# Patient Record
Sex: Male | Born: 1985 | Hispanic: No | Marital: Single | Smoking: Never smoker
Health system: Southern US, Community
[De-identification: ages and names within clinical notes are randomized; demographics above are authoritative.]

## PROBLEM LIST (undated history)

## (undated) HISTORY — PX: HAND SURGERY: SHX662

---
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
## 2007-11-04 ENCOUNTER — Emergency Department (HOSPITAL_COMMUNITY): Admission: EM | Admit: 2007-11-04 | Discharge: 2007-11-04 | Payer: Self-pay | Admitting: Emergency Medicine

## 2011-05-10 ENCOUNTER — Ambulatory Visit: Payer: Self-pay | Admitting: Internal Medicine

## 2011-05-10 LAB — RAPID STREP-A WITH REFLX: Micro Text Report: NEGATIVE

## 2011-05-12 LAB — BETA STREP CULTURE(ARMC)

## 2012-04-26 ENCOUNTER — Ambulatory Visit: Payer: Self-pay | Admitting: Emergency Medicine

## 2012-08-25 ENCOUNTER — Emergency Department: Payer: Self-pay | Admitting: Emergency Medicine

## 2012-08-25 ENCOUNTER — Ambulatory Visit: Payer: Self-pay

## 2013-05-17 ENCOUNTER — Ambulatory Visit: Payer: Self-pay | Admitting: Physician Assistant

## 2013-05-21 ENCOUNTER — Ambulatory Visit: Payer: Self-pay | Admitting: Orthopedic Surgery

## 2014-04-09 HISTORY — PX: HAND SURGERY: SHX662

## 2014-07-31 NOTE — Op Note (Signed)
PATIENT NAME:  Maxwell MillinRKER, [name] MR#:  [id number] DATE OF BIRTH:  1985/06/19  DATE OF PROCEDURE:  05/21/2013  PREOPERATIVE DIAGNOSIS: Right distal fifth metacarpal fracture.   POSTOPERATIVE DIAGNOSIS: Right distal fifth metacarpal fracture.   PROCEDURE: Closed reduction and percutaneous pinning, right distal fifth metacarpal fracture.   ANESTHESIA: General.   SURGEON: Leitha SchullerMichael J. Milton Streicher, M.D.   DESCRIPTION OF PROCEDURE: The patient was brought to the operating room and after adequate anesthesia was obtained, the right arm was prepped and draped in the usual sterile fashion. Appropriate patient identification and timeout procedures were completed. The mini C-arm was brought in, and with flexion of the IP joint and MCP joint, essentially anatomical reduction of the metacarpal neck fracture was obtained. Two K-wires were then inserted in a crossed fashion from the fifth metacarpal head into the fourth metacarpal shaft, and after good position of the pins was identified, the pins were bent over, cut short. Xeroform was placed around the base of the pins, followed by 4 x 4's, Webril and an ulnar gutter splint, followed by an Ace wrap. The patient was then sent to the recovery room in stable condition.   ESTIMATED BLOOD LOSS: Minimal.   COMPLICATIONS: None.   SPECIMEN: None.   IMPLANT: A 0.45 K-wire x 2.   ____________________________ Leitha SchullerMichael J. Finnis Colee, MD mjm:gb D: 05/21/2013 20:01:54 ET T: 05/22/2013 03:23:10 ET JOB#: [id number]  cc: Leitha SchullerMichael J. Yaphet Smethurst, MD, <Dictator> Leitha SchullerMICHAEL J Dezire Turk MD ELECTRONICALLY SIGNED 05/22/2013 12:46

## 2016-04-12 ENCOUNTER — Ambulatory Visit (INDEPENDENT_AMBULATORY_CARE_PROVIDER_SITE_OTHER): Payer: Worker's Compensation

## 2016-04-12 ENCOUNTER — Ambulatory Visit
Admission: EM | Admit: 2016-04-12 | Discharge: 2016-04-12 | Disposition: A | Discharge status: Home / Self Care | Payer: Worker's Compensation | Attending: Family Medicine | Admitting: Family Medicine

## 2016-04-12 DIAGNOSIS — G5602 Carpal tunnel syndrome, left upper limb: Secondary | ICD-10-CM

## 2016-04-12 MED ORDER — NAPROXEN 500 MG PO TABS: 500.0000 mg | ORAL_TABLET | Freq: Two times a day (BID) | ORAL | 0 refills | Status: DC

## 2016-04-12 NOTE — Discharge Instructions (Signed)
Wear the wrist splint for full-time coming out only for personal care for 1 week. Wear the wrist splint for all activities particularly at work for the next month. Take Naprosyn twice daily with food. Follow up with Glenard Haringommy Moore, FNP at [facility] occupational health next week.

## 2016-04-12 NOTE — ED Triage Notes (Signed)
He states he has left hand pian, that has lasted for over a month. The pain is constant. 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

## 2016-04-12 NOTE — ED Provider Notes (Signed)
CSN: [id number]     Arrival date & time 04/12/16  1706 History   First MD Initiated Contact with Patient 04/12/16 1845     Chief Complaint  Patient presents with  . Hand Pain    left hand   (Consider location/radiation/quality/duration/timing/severity/associated sxs/prior Treatment) HPI  This a 31 year old male who presents with non-dominant left wrist pain and numbness that he has noticed for over a month. He states that the pain is constant and he has intermittent numbness and tingling in a median distribution. He states that it also bothers him whenever he is driving. It has not awakened him at night. 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 Will grab the corneal with his left hand place it on the engine and use a drill with the right hand. He states that it hurts every day that he uses it. Not been taking any medications.       History reviewed. No pertinent past medical history. History reviewed. No pertinent surgical history. History reviewed. No pertinent family history. Social History  Substance Use Topics  . Smoking status: Never Smoker  . Smokeless tobacco: Never Used  . Alcohol use No    Review of Systems  Constitutional: Positive for activity change. Negative for chills, fatigue and fever.  Musculoskeletal: Positive for myalgias.  Neurological: Positive for numbness.  All other systems reviewed and are negative.   Allergies  Patient has no known allergies.  Home Medications   Prior to Admission medications   Medication Sig Start Date End Date Taking? Authorizing Provider  naproxen (NAPROSYN) 500 MG tablet Take 1 tablet (500 mg total) by mouth 2 (two) times daily with a meal. 04/12/16   Lutricia FeilWilliam P Smayan Hackbart, PA-C   Meds Ordered and Administered this Visit  Medications - No data to display  BP (!) 148/69 (BP Location: Left Arm)   Pulse 82   Temp 98 F (36.7 C) (Oral)   Resp 18   Ht 5\' 9"  (1.753 m)   Wt  295 lb (133.8 kg)   SpO2 99%   BMI 43.56 kg/m  No data found.   Physical Exam  Constitutional: He is oriented to person, place, and time. He appears well-developed and well-nourished.  HENT:  Head: Normocephalic and atraumatic.  Eyes: EOM are normal. Pupils are equal, round, and reactive to light. Right eye exhibits no discharge. Left eye exhibits no discharge.  Neck: Normal range of motion. Neck supple.  Musculoskeletal: Normal range of motion. He exhibits tenderness. He exhibits no edema or deformity.  Examination of the left hand and wrist shows a normal sweating pattern. No atrophy observed. The thenar muscle is strong. Patient does have a Tinel's at the wrist in a median distribution. He does have a positive Phalen's sign after approximately 1 minute with burning into his fingers.  Neurological: He is alert and oriented to person, place, and time.  Skin: Skin is warm and dry.  Psychiatric: He has a normal mood and affect. His behavior is normal. Judgment and thought content normal.  Nursing note and vitals reviewed.   Urgent Care Course   Clinical Course     Procedures (including critical care time)  Labs Review Labs Reviewed - No data to display  Imaging Review Dg Wrist Complete Left  Result Date: 04/12/2016 CLINICAL DATA:  Numbness EXAM: LEFT WRIST - COMPLETE 3+ VIEW COMPARISON:  None. FINDINGS: There is no evidence of fracture or dislocation. There is no evidence  of arthropathy or other focal bone abnormality. Soft tissues are unremarkable. IMPRESSION: Negative. Electronically Signed   By: Charlett Nose M.D.   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 Test/x-ray results and diagnosis reviewed with patient 2. rx as per orders; risks, benefits, potential side effects reviewed with patient 3. Recommend supportive treatment with Use of left wrist splint for 1 week continuously removing only to perform personal care. He should use the wrist splint at nighttime and at work for 30 days. I've given him Naprosyn for the anti-inflammatory effect. He will follow-up with Glenard Haring, FNP at [facility] occupational health in one week. 4. F/u prn if symptoms worsen or don't improve     Lutricia Feil, PA-C 04/12/16 1920

## 2017-04-27 IMAGING — CR DG WRIST COMPLETE 3+V*L*
4 series · 4 of 4 positions shown · non-contrast
Comparison: None.

CLINICAL DATA: Numbness

EXAM:
LEFT WRIST - COMPLETE 3+ VIEW

[wrist pa]
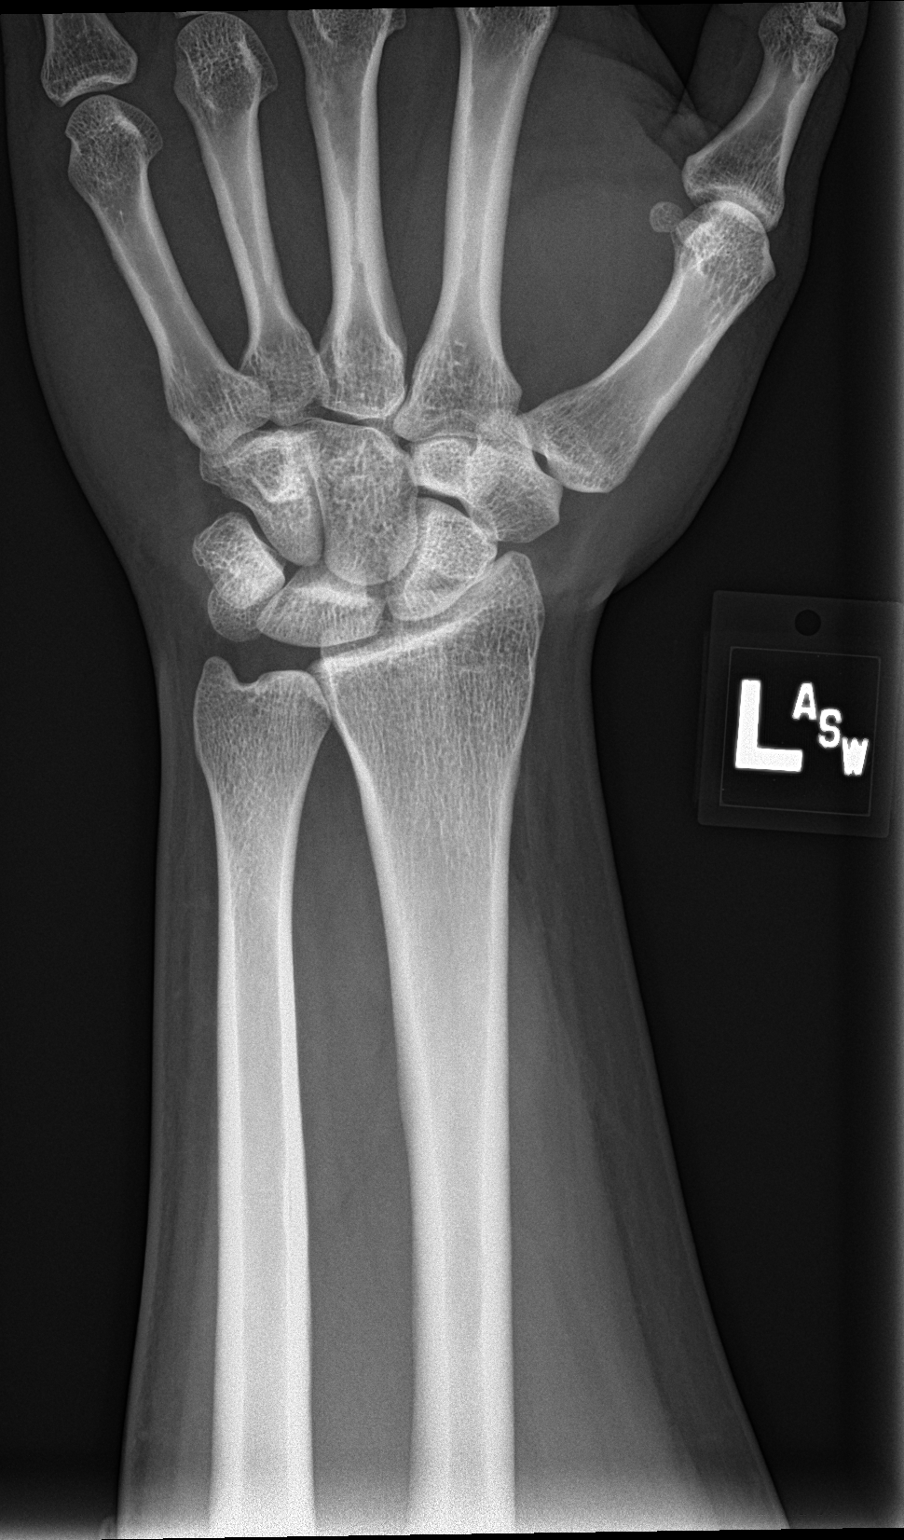

[wrist obl]
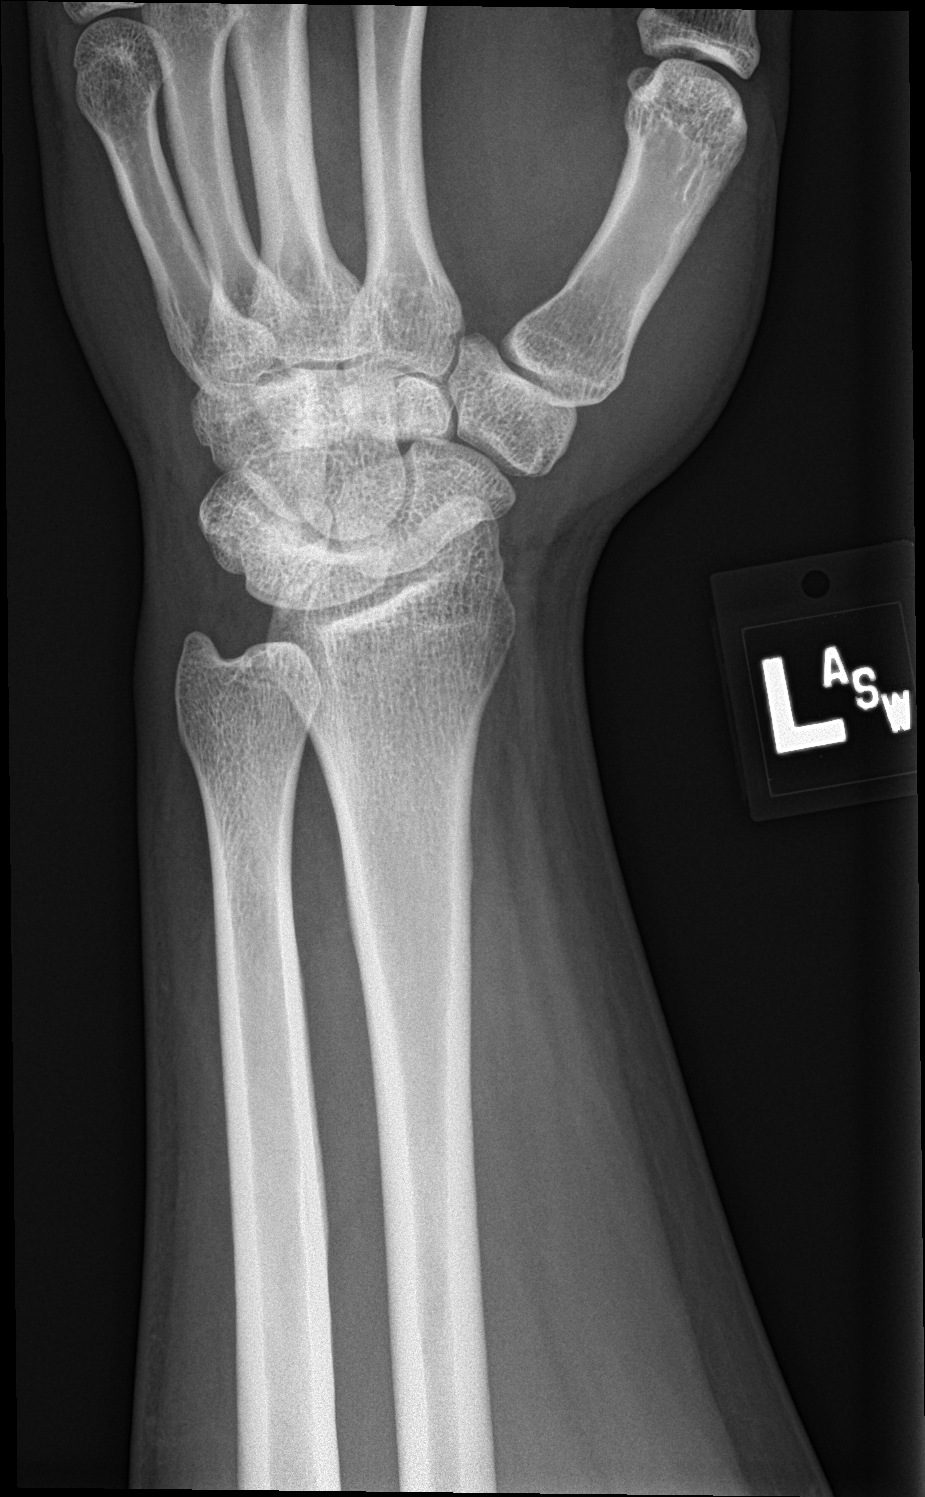

[wrist lat]
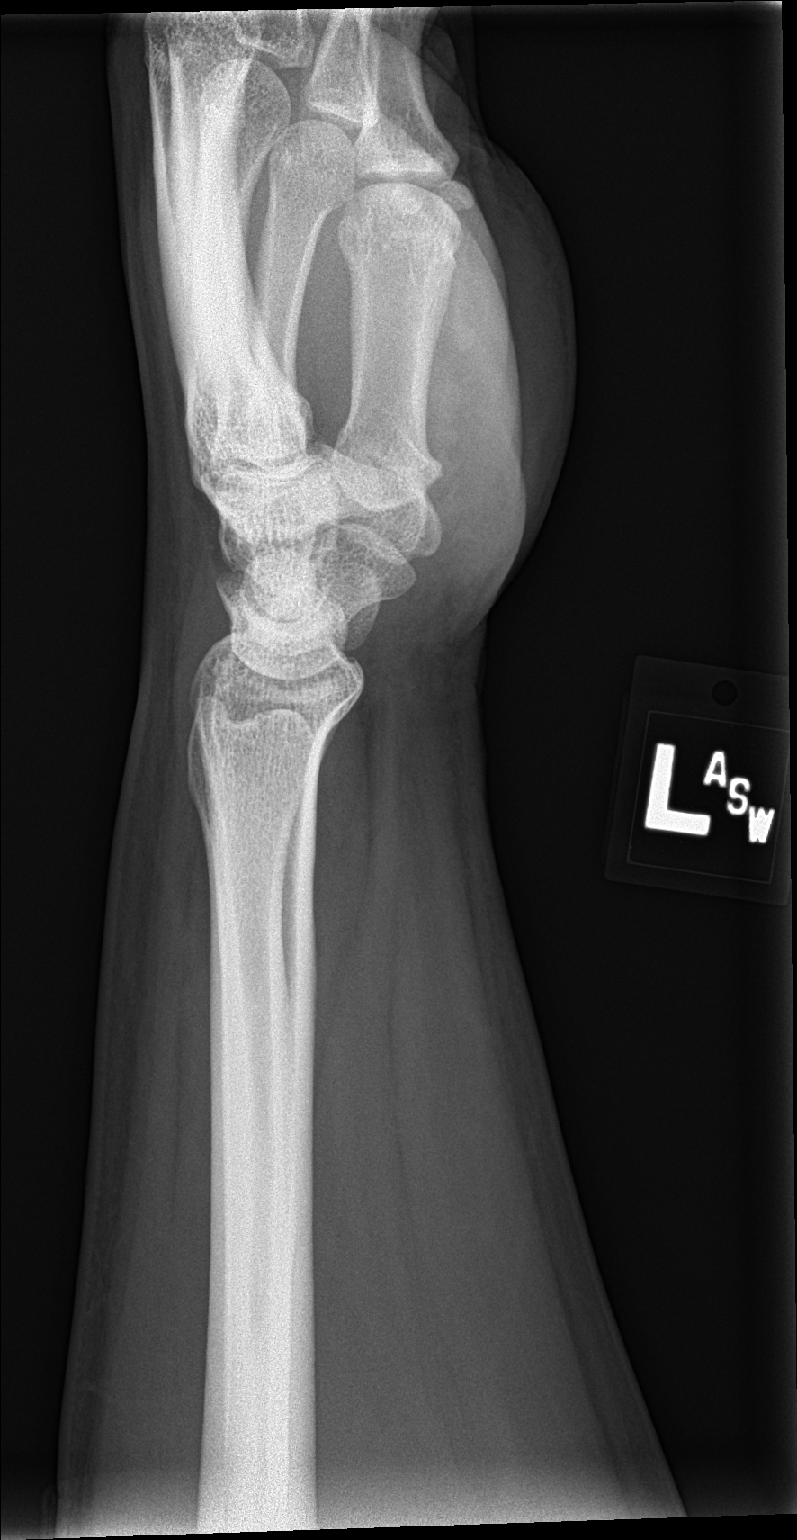

[wrist navicular]
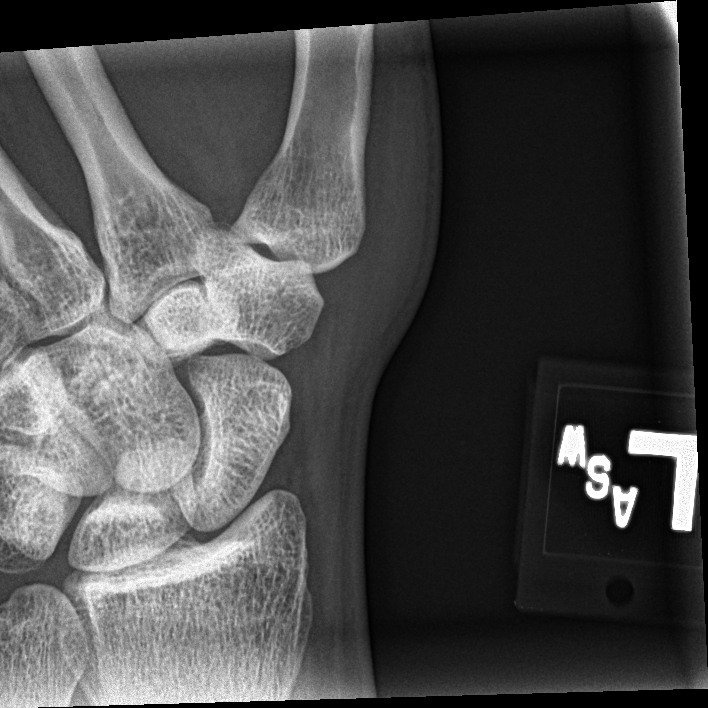

[4 of 4 positions shown; findings below may reference images not displayed]

FINDINGS: There is no evidence of fracture or dislocation. There is no
evidence of arthropathy or other focal bone abnormality. Soft
tissues are unremarkable.
IMPRESSION: Negative.

## 2021-05-22 ENCOUNTER — Other Ambulatory Visit: Payer: Self-pay

## 2021-05-22 ENCOUNTER — Ambulatory Visit
Admission: EM | Admit: 2021-05-22 | Discharge: 2021-05-22 | Disposition: A | Discharge status: Home / Self Care | Payer: BC Managed Care – PPO | Attending: Internal Medicine | Admitting: Internal Medicine

## 2021-05-22 ENCOUNTER — Encounter: Payer: Self-pay | Admitting: Emergency Medicine

## 2021-05-22 DIAGNOSIS — M7918 Myalgia, other site: Secondary | ICD-10-CM

## 2021-05-22 MED ORDER — IBUPROFEN 600 MG PO TABS: 600.0000 mg | ORAL_TABLET | Freq: Four times a day (QID) | ORAL | 0 refills | Status: DC | PRN

## 2021-05-22 MED ORDER — METHOCARBAMOL 500 MG PO TABS: 500.0000 mg | ORAL_TABLET | Freq: Every day | ORAL | 0 refills | Status: DC

## 2021-05-22 NOTE — ED Provider Notes (Signed)
[facility]    CSN: [id number] Arrival date & time: 05/22/21  1131      History   Chief Complaint Chief Complaint  Patient presents with   Shoulder Pain    left    HPI Maxwell Joyce is a 36 y.o. male comes to the urgent care with a 3-day history of left upper back pain.  Symptoms started fairly suddenly and has been persistent.  He denies any trauma or falls to the left upper back area.  Pain is throbbing, intermittent, aggravated by palpation and denies any relieving factors.  Pain is not associated with numbness or bruising.  Patient's job requires repetitive movement of the left upper extremity.  This is the first episode of such symptoms.  Patient denies any fever or chills.  No upper respiratory infection symptoms.Marland Kitchen   HPI  History reviewed. No pertinent past medical history.  There are no problems to display for this patient.   History reviewed. No pertinent surgical history.     Home Medications    Prior to Admission medications   Medication Sig Start Date End Date Taking? Authorizing Provider  ibuprofen (ADVIL) 600 MG tablet Take 1 tablet (600 mg total) by mouth every 6 (six) hours as needed. 05/22/21  Yes Shanitra Phillippi, Britta Mccreedy, MD  methocarbamol (ROBAXIN) 500 MG tablet Take 1 tablet (500 mg total) by mouth at bedtime. 05/22/21  Yes Fonnie Crookshanks, Britta Mccreedy, MD    Family History History reviewed. No pertinent family history.  Social History Social History   Tobacco Use   Smoking status: Never   Smokeless tobacco: Never  Vaping Use   Vaping Use: Never used  Substance Use Topics   Alcohol use: No   Drug use: No     Allergies   Patient has no known allergies.   Review of Systems Review of Systems  Musculoskeletal:  Positive for back pain and myalgias. Negative for arthralgias, gait problem, joint swelling, neck pain and neck stiffness.  Skin: Negative.     Physical Exam Triage Vital Signs ED Triage Vitals  Enc Vitals Group     BP 05/22/21  1147 130/81     Pulse Rate 05/22/21 1147 80     Resp 05/22/21 1147 18     Temp 05/22/21 1147 98.4 F (36.9 C)     Temp Source 05/22/21 1147 Oral     SpO2 05/22/21 1147 100 %     Weight 05/22/21 1146 294 lb 15.6 oz (133.8 kg)     Height 05/22/21 1146 5\' 9"  (1.753 m)     Head Circumference --      Peak Flow --      Pain Score 05/22/21 1145 6     Pain Loc --      Pain Edu? --      Excl. in GC? --    No data found.  Updated Vital Signs BP 130/81 (BP Location: Left Arm)    Pulse 80    Temp 98.4 F (36.9 C) (Oral)    Resp 18    Ht 5\' 9"  (1.753 m)    Wt 133.8 kg    SpO2 100%    BMI 43.56 kg/m   Visual Acuity Right Eye Distance:   Left Eye Distance:   Bilateral Distance:    Right Eye Near:   Left Eye Near:    Bilateral Near:     Physical Exam Vitals and nursing note reviewed.  Constitutional:      General: He is not  in acute distress.    Appearance: He is not ill-appearing.  Cardiovascular:     Rate and Rhythm: Normal rate and regular rhythm.  Pulmonary:     Effort: Pulmonary effort is normal.     Breath sounds: Normal breath sounds.  Abdominal:     General: Bowel sounds are normal.     Palpations: Abdomen is soft.  Musculoskeletal:        General: Tenderness present. No swelling, deformity or signs of injury. Normal range of motion.  Skin:    General: Skin is warm.     Findings: No bruising or erythema.  Neurological:     Mental Status: He is alert.     UC Treatments / Results  Labs (all labs ordered are listed, but only abnormal results are displayed) Labs Reviewed - No data to display  EKG   Radiology No results found.  Procedures Procedures (including critical care time)  Medications Ordered in UC Medications - No data to display  Initial Impression / Assessment and Plan / UC Course  I have reviewed the triage vital signs and the nursing notes.  Pertinent labs & imaging results that were available during my care of the patient were reviewed by me  and considered in my medical decision making (see chart for details).     1.  Paraspinal muscle pain: Patient is currently denying any pain Physical exam is negative for masses Heating pad use on a 20-minute on-20-minute of cycle recommended Gentle range of motion exercises Robaxin at bedtime as needed for muscle stiffness Ibuprofen 600 mg every 6 hours as needed for pain Return precautions given. Final Clinical Impressions(s) / UC Diagnoses   Final diagnoses:  Pain of paraspinal muscle     Discharge Instructions      Gentle range of motion exercises Heating pad use on a 20-minute on-20 minutes off cycle recommended Take medications as prescribed Please do not drive or operate heavy machinery after taking muscle relaxants.  Muscle relaxants makes you drowsy. Return to urgent care if symptoms worsen.   ED Prescriptions     Medication Sig Dispense Auth. Provider   methocarbamol (ROBAXIN) 500 MG tablet Take 1 tablet (500 mg total) by mouth at bedtime. 20 tablet Carigan Lister, Britta Mccreedy, MD   ibuprofen (ADVIL) 600 MG tablet Take 1 tablet (600 mg total) by mouth every 6 (six) hours as needed. 30 tablet Berdena Cisek, Britta Mccreedy, MD      PDMP not reviewed this encounter.   Merrilee Jansky, MD 05/22/21 1301

## 2021-05-22 NOTE — ED Triage Notes (Signed)
Pt c/o left shoulder pain. He states it is more in his shoulder blade. Started about 3 days ago but worse today. He denies injury. He states he can lift his arm but if he tries to move it other directions the pain is worse.

## 2021-05-22 NOTE — Discharge Instructions (Signed)
Gentle range of motion exercises Heating pad use on a 20-minute on-20 minutes off cycle recommended Take medications as prescribed Please do not drive or operate heavy machinery after taking muscle relaxants.  Muscle relaxants makes you drowsy. Return to urgent care if symptoms worsen.

## 2021-09-08 ENCOUNTER — Ambulatory Visit: Payer: Self-pay | Admitting: Family Medicine

## 2022-02-14 ENCOUNTER — Ambulatory Visit
Admission: EM | Admit: 2022-02-14 | Discharge: 2022-02-14 | Disposition: A | Discharge status: Home / Self Care | Payer: BC Managed Care – PPO | Attending: Emergency Medicine | Admitting: Emergency Medicine

## 2022-02-14 ENCOUNTER — Encounter: Payer: Self-pay | Admitting: Emergency Medicine

## 2022-02-14 DIAGNOSIS — J069 Acute upper respiratory infection, unspecified: Secondary | ICD-10-CM | POA: Diagnosis not present

## 2022-02-14 MED ORDER — AMOXICILLIN-POT CLAVULANATE 875-125 MG PO TABS: 1.0000 | ORAL_TABLET | Freq: Two times a day (BID) | ORAL | 0 refills | Status: AC

## 2022-02-14 MED ORDER — BENZONATATE 100 MG PO CAPS: 100.0000 mg | ORAL_CAPSULE | Freq: Three times a day (TID) | ORAL | 0 refills | Status: DC

## 2022-02-14 MED ORDER — PROMETHAZINE-DM 6.25-15 MG/5ML PO SYRP: 5.0000 mL | ORAL_SOLUTION | Freq: Four times a day (QID) | ORAL | 0 refills | Status: DC | PRN

## 2022-02-14 MED ORDER — PREDNISONE 20 MG PO TABS: 40.0000 mg | ORAL_TABLET | Freq: Every day | ORAL | 0 refills | Status: DC

## 2022-02-14 NOTE — ED Provider Notes (Signed)
[facility]    CSN: [id number] Arrival date & time: 02/14/22  0916      History   Chief Complaint Chief Complaint  Patient presents with   Shortness of Breath   Cough   Nasal Congestion    HPI Maxwell Joyce is a 36 y.o. male.   Patient presents with a productive and nonproductive cough, shortness of breath with exertion and wheezing present for 3 weeks.  Symptoms are not worsening but are persisting.  Shortness of breath is typically relieved within 5 to 10 minutes after rest.  Symptoms initially began as a " head cold" that moved into the chest.  No known sick contacts.  Tolerating liquids.  Has attempted use of Mucinex, Robitussin and Alka-Seltzer cold and flu which has provided minimal relief.  Denies respiratory history.  Non-smoker.  Denies fever, chills, body aches, nasal congestion, ear pain, sore throat.  History reviewed. No pertinent past medical history.  There are no problems to display for this patient.   Past Surgical History:  Procedure Laterality Date   HAND SURGERY Right        Home Medications    Prior to Admission medications   Medication Sig Start Date End Date Taking? Authorizing Provider  ibuprofen (ADVIL) 600 MG tablet Take 1 tablet (600 mg total) by mouth every 6 (six) hours as needed. 05/22/21   Lamptey, Britta Mccreedy, MD  methocarbamol (ROBAXIN) 500 MG tablet Take 1 tablet (500 mg total) by mouth at bedtime. 05/22/21   Lamptey, Britta Mccreedy, MD    Family History History reviewed. No pertinent family history.  Social History Social History   Tobacco Use   Smoking status: Never   Smokeless tobacco: Never  Vaping Use   Vaping Use: Never used  Substance Use Topics   Alcohol use: No   Drug use: No     Allergies   Patient has no known allergies.   Review of Systems Review of Systems  Constitutional: Negative.   HENT: Negative.    Respiratory:  Positive for cough, shortness of breath and wheezing. Negative for apnea, choking,  chest tightness and stridor.   Cardiovascular: Negative.   Gastrointestinal: Negative.   Skin: Negative.   Neurological: Negative.      Physical Exam Triage Vital Signs ED Triage Vitals  Enc Vitals Group     BP 02/14/22 0947 134/85     Pulse Rate 02/14/22 0947 79     Resp 02/14/22 0947 16     Temp 02/14/22 0947 98.4 F (36.9 C)     Temp Source 02/14/22 0947 Oral     SpO2 02/14/22 0947 96 %     Weight --      Height --      Head Circumference --      Peak Flow --      Pain Score 02/14/22 0945 0     Pain Loc --      Pain Edu? --      Excl. in GC? --    No data found.  Updated Vital Signs BP 134/85 (BP Location: Left Arm)   Pulse 79   Temp 98.4 F (36.9 C) (Oral)   Resp 16   SpO2 96%   Visual Acuity Right Eye Distance:   Left Eye Distance:   Bilateral Distance:    Right Eye Near:   Left Eye Near:    Bilateral Near:     Physical Exam Constitutional:      Appearance: He is well-developed.  HENT:     Head: Normocephalic.     Right Ear: Tympanic membrane, ear canal and external ear normal.     Left Ear: Tympanic membrane, ear canal and external ear normal.     Nose: Nose normal.     Mouth/Throat:     Mouth: Mucous membranes are moist.     Pharynx: Oropharynx is clear.  Eyes:     Extraocular Movements: Extraocular movements intact.  Cardiovascular:     Rate and Rhythm: Normal rate and regular rhythm.     Pulses: Normal pulses.     Heart sounds: Normal heart sounds.  Pulmonary:     Effort: Pulmonary effort is normal.     Breath sounds: Wheezing present.  Skin:    General: Skin is warm and dry.  Neurological:     Mental Status: He is alert and oriented to person, place, and time. Mental status is at baseline.  Psychiatric:        Mood and Affect: Mood normal.        Behavior: Behavior normal.      UC Treatments / Results  Labs (all labs ordered are listed, but only abnormal results are displayed) Labs Reviewed - No data to  display  EKG   Radiology No results found.  Procedures Procedures (including critical care time)  Medications Ordered in UC Medications - No data to display  Initial Impression / Assessment and Plan / UC Course  I have reviewed the triage vital signs and the nursing notes.  Pertinent labs & imaging results that were available during my care of the patient were reviewed by me and considered in my medical decision making (see chart for details).  Acute upper respiratory infection  Vital signs are stable, patient is in no signs of distress nor toxic appearing, as symptoms have persisted for 3 weeks without signs of resolution we will provide bacterial coverage, Augmentin prescribed, as wheezing is heard to auscultation additionally prednisone, Tessalon and Promethazine DM prescribed for additional supportive measures, may continue use of over-the-counter medications if deemed helpful, given precautions to follow-up if symptoms continue to persist past use of medications Final Clinical Impressions(s) / UC Diagnoses   Final diagnoses:  None   Discharge Instructions   None    ED Prescriptions   None    PDMP not reviewed this encounter.   Valinda Hoar, NP 02/14/22 1008

## 2022-02-14 NOTE — ED Triage Notes (Signed)
Pt presents with a cough, runny nose and SOB x 3 weeks.

## 2022-02-14 NOTE — Discharge Instructions (Signed)
While your symptoms most likely started out as a virus as they have persisted for 3 weeks it is very possible that bacteria is now present and is prolonging your symptoms therefore you will be started on antibiotic today  On your exam I was able to hear wheezing which is a sign of constriction to the airway which is why you are experiencing shortness of breath  Take Augmentin every morning and every evening for 10 days ideally you will begin to see improvement in about 48 hours  Begin prednisone every morning with food for 5 days, this reduces inflammation and irritation to the airways which will help calm your wheezing and shortness of breath  You may use Tessalon pill every 8 hours as needed to help calm your coughing  You may use cough syrup every 6 hours as needed for additional comfort, be mindful this will make you drowsy    You can take Tylenol and/or Ibuprofen as needed for fever reduction and pain relief.   For cough: honey 1/2 to 1 teaspoon (you can dilute the honey in water or another fluid).  You can also use guaifenesin and dextromethorphan for cough. You can use a humidifier for chest congestion and cough.  If you don't have a humidifier, you can sit in the bathroom with the hot shower running.      For sore throat: try warm salt water gargles, cepacol lozenges, throat spray, warm tea or water with lemon/honey, popsicles or ice, or OTC cold relief medicine for throat discomfort.   For congestion: take a daily anti-histamine like Zyrtec, Claritin, and a oral decongestant, such as pseudoephedrine.  You can also use Flonase 1-2 sprays in each nostril daily.   It is important to stay hydrated: drink plenty of fluids (water, gatorade/powerade/pedialyte, juices, or teas) to keep your throat moisturized and help further relieve irritation/discomfort.

## 2022-03-20 ENCOUNTER — Other Ambulatory Visit: Payer: Self-pay | Admitting: Family Medicine

## 2022-03-20 ENCOUNTER — Ambulatory Visit: Payer: BC Managed Care – PPO | Admitting: Family Medicine

## 2022-03-20 ENCOUNTER — Encounter: Payer: Self-pay | Admitting: Family Medicine

## 2022-03-20 VITALS — BP 122/73 | HR 80 | Ht 69.5 in | Wt 317.0 lb

## 2022-03-20 DIAGNOSIS — G4719 Other hypersomnia: Secondary | ICD-10-CM

## 2022-03-20 DIAGNOSIS — Z0000 Encounter for general adult medical examination without abnormal findings: Secondary | ICD-10-CM

## 2022-03-20 DIAGNOSIS — R29818 Other symptoms and signs involving the nervous system: Secondary | ICD-10-CM | POA: Diagnosis not present

## 2022-03-20 DIAGNOSIS — Z7689 Persons encountering health services in other specified circumstances: Secondary | ICD-10-CM

## 2022-03-20 DIAGNOSIS — Z131 Encounter for screening for diabetes mellitus: Secondary | ICD-10-CM

## 2022-03-20 DIAGNOSIS — Z6841 Body Mass Index (BMI) 40.0 and over, adult: Secondary | ICD-10-CM

## 2022-03-20 DIAGNOSIS — Z13220 Encounter for screening for lipoid disorders: Secondary | ICD-10-CM

## 2022-03-20 NOTE — Progress Notes (Signed)
Subjective:    Patient ID: Maxwell Joyce, male    DOB: 02-04-1986, 36 y.o.   MRN: [id number]  Maxwell Joyce is a 35 y.o. male presenting on 03/20/2022 for Establish Care  Here to establish care with new PCP  HPI  Suspected Sleep Apnea Fatigue / Tired He feels drained overall for several months, not improving. Feels tired Does not take in caffeine much. No sodas or energy drinks. Mostly drinks water and some powerade Sleep occasionally wake up overnight to use restroom Tried Vitamin B12 OTC no help.  Morbid Obesity BMI >46 Weight up to 317 lbs,  prior 295 lbs. 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 He is R hand dominant History of Carpal Tunnel L side - may affect him if he is driving but not at work. History of R hand boxer's fracture   Epworth Sleepiness Scale Total Score: 18 Sitting and reading - 3 Watching TV - 3 Sitting inactive in a public place - 3 As a passenger in a car for an hour without a break - 3 Lying down to rest in the afternoon when circumstances permit - 3 Sitting and talking to someone - 0 Sitting quietly after a lunch without alcohol - 3 In a car, while stopped for a few minutes in traffic - 0   STOP-Bang OSA scoring Snoring yes   Tiredness yes   Observed apneas no   Pressure HTN no   BMI > 35 kg/m2 yes   Age > 50  no   Neck (male >17 in; Male >16 in)  yes >17"  Gender male yes   OSA risk low (0-2)  OSA risk intermediate (3-4)  OSA risk high (5+)  Total: 5 high risk         03/20/2022   10:30 AM  Depression screen PHQ 2/9  Decreased Interest 2  Down, Depressed, Hopeless 0  PHQ - 2 Score 2  Altered sleeping 0  Tired, decreased energy 3  Change in appetite 0  Feeling bad or failure  about yourself  0  Trouble concentrating 0  Moving slowly or fidgety/restless 0  Suicidal thoughts 0  PHQ-9 Score 5  Difficult doing work/chores Not difficult at all    History reviewed. No pertinent past medical history. Past Surgical History:  Procedure Laterality Date   HAND SURGERY Right 2016   Boxer's fracture, repaired   TONSILLECTOMY Bilateral 1994   Social History   Socioeconomic History   Marital status: Single    Spouse name: Not on file   Number of children: Not on file   Years of education: Not on file   Highest education level: Not on file  Occupational History   Not on file  Tobacco Use   Smoking status: Never   Smokeless tobacco: Never  Vaping Use   Vaping Use: Never used  Substance and Sexual Activity   Alcohol use: No   Drug use: No   Sexual activity: Not on file  Other Topics Concern   Not on file  Social History Narrative   Not on file   Social Determinants of Health   Financial Resource Strain: Not on file  Food Insecurity: Not on file  Transportation Needs: Not on file  Physical Activity:  Not on file  Stress: Not on file  Social Connections: Not on file  Intimate Partner Violence: Not on file   Family History  Problem Relation Age of Onset   Hypertension Father    Stroke Maternal Grandmother    Dementia Maternal Grandmother    Parkinson's disease Paternal Grandmother    No current outpatient medications on file prior to visit.   No current facility-administered medications on file prior to visit.    Review of Systems Per HPI unless specifically indicated above      Objective:    BP 122/73   Pulse 80   Ht 5' 9.5" (1.765 m)   Wt (!) 317 lb (143.8 kg)   SpO2 99%   BMI 46.14 kg/m   Wt Readings from Last 3 Encounters:  03/20/22 (!) 317 lb (143.8 kg)  05/22/21 294 lb 15.6 oz (133.8 kg)  04/12/16 295 lb (133.8 kg)    Physical Exam Vitals and nursing note reviewed.  Constitutional:      General: He is not in acute  distress.    Appearance: Normal appearance. He is well-developed. He is obese. He is not diaphoretic.     Comments: Well-appearing, comfortable, cooperative  HENT:     Head: Normocephalic and atraumatic.  Eyes:     General:        Right eye: No discharge.        Left eye: No discharge.     Conjunctiva/sclera: Conjunctivae normal.  Cardiovascular:     Rate and Rhythm: Normal rate.  Pulmonary:     Effort: Pulmonary effort is normal.  Skin:    General: Skin is warm and dry.     Findings: No erythema or rash.  Neurological:     Mental Status: He is alert and oriented to person, place, and time.  Psychiatric:        Mood and Affect: Mood normal.        Behavior: Behavior normal.        Thought Content: Thought content normal.     Comments: Well groomed, good eye contact, normal speech and thoughts      No results found for this or any previous visit.    Assessment & Plan:   Problem List Items Addressed This Visit     Morbid obesity with BMI of 45.0-49.9, adult (HCC)   Other Visit Diagnoses     Excessive daytime sleepiness    -  Primary   Encounter to establish care with new doctor       Suspected sleep apnea           Establish care  New problem with w persistent clinical concern for suspected obstructive sleep apnea given reported symptoms without witnessed apnea, but with snoring and sleep disturbance, fatigue excessive sleepiness. - Screening: ESS score 18 / STOP-Bang Score 5 high risk - Neck Circumference: >17" - Co-morbidities: Obesity  Plan: 1. Discussion on initial diagnosis and testing for OSA, risk factors, management, complications 2. Agree to proceed with sleep study testing based on clinical concerns - will fax referral to [facility] - to arrange initial PSG home vs in sleep lab and further evaluation.   No orders of the defined types were placed in this encounter.    Follow up plan: Return in about 2 months (around 05/21/2022) for 2  month fasting lab only then 1 week later Annual Physical.  Future labs ordered 05/21/22 CMET LIPID A1c CBC TSH  Saralyn Pilar, DO [facility] 03/20/2022, 10:33 AM

## 2022-03-20 NOTE — Patient Instructions (Addendum)
Thank you for coming to the office today.  We will also check blood next time and that may help [place] understand other causes of fatigue.  [facility] Address: [address] [place], [place], [place] [address] Phone: [phone]  Referral sent via fax today. Stay tuned to hear back from them, they should call you to schedule initial sleep study, likely a home test and then if abnormal - they will arrange the 2nd part of the sleep study in the lab with the CPAP machine.  If there is any issue with this company, for example not covered by insurance or other problem, please notify us and they may do so a well - we will need to change the location of the referral.  DUE for FASTING BLOOD WORK (no food or drink after midnight before the lab appointment, only water or coffee without cream/sugar on the morning of)  SCHEDULE "Lab Only" visit in the morning at the clinic for lab draw in 2 MONTHS   - Make sure Lab Only appointment is at about 1 week before your next appointment, so that results will be available  For Lab Results, once available within 2-3 days of blood draw, you can can log in to MyChart online to view your results and a brief explanation. Also, we can discuss results at next follow-up visit.    Please schedule a Follow-up Appointment to: Return in about 2 months (around 05/21/2022) for 2 month fasting lab only then 1 week later Annual Physical.  If you have any other questions or concerns, please feel free to call the office or send a message through MyChart. You may also schedule an earlier appointment if necessary.  Additionally, you may be receiving a survey about your experience at our office within a few days to 1 week by e-mail or mail. We value your feedback.  Saralyn Pilar, DO [facility] [place]

## 2022-05-11 DIAGNOSIS — G4733 Obstructive sleep apnea (adult) (pediatric): Secondary | ICD-10-CM | POA: Diagnosis not present

## 2022-05-17 ENCOUNTER — Encounter: Payer: Self-pay | Admitting: Family Medicine

## 2022-05-17 DIAGNOSIS — G4733 Obstructive sleep apnea (adult) (pediatric): Secondary | ICD-10-CM | POA: Insufficient documentation

## 2022-05-18 ENCOUNTER — Other Ambulatory Visit: Payer: Self-pay

## 2022-05-18 DIAGNOSIS — Z131 Encounter for screening for diabetes mellitus: Secondary | ICD-10-CM

## 2022-05-18 DIAGNOSIS — Z13220 Encounter for screening for lipoid disorders: Secondary | ICD-10-CM

## 2022-05-18 DIAGNOSIS — Z0000 Encounter for general adult medical examination without abnormal findings: Secondary | ICD-10-CM

## 2022-05-21 ENCOUNTER — Other Ambulatory Visit: Payer: BC Managed Care – PPO

## 2022-05-29 ENCOUNTER — Other Ambulatory Visit: Payer: BC Managed Care – PPO

## 2022-05-29 DIAGNOSIS — Z13220 Encounter for screening for lipoid disorders: Secondary | ICD-10-CM | POA: Diagnosis not present

## 2022-05-29 DIAGNOSIS — Z6841 Body Mass Index (BMI) 40.0 and over, adult: Secondary | ICD-10-CM | POA: Diagnosis not present

## 2022-05-29 DIAGNOSIS — Z0000 Encounter for general adult medical examination without abnormal findings: Secondary | ICD-10-CM | POA: Diagnosis not present

## 2022-05-29 DIAGNOSIS — Z131 Encounter for screening for diabetes mellitus: Secondary | ICD-10-CM | POA: Diagnosis not present

## 2022-05-30 LAB — CBC WITH DIFFERENTIAL/PLATELET
Absolute Monocytes: 308 cells/uL (ref 200–950)
Basophils Absolute: 46 cells/uL (ref 0–200)
Basophils Relative: 0.6 %
Eosinophils Absolute: 100 cells/uL (ref 15–500)
Eosinophils Relative: 1.3 %
HCT: 45.1 % (ref 38.5–50.0)
Hemoglobin: 15.1 g/dL (ref 13.2–17.1)
Lymphs Abs: 2903 cells/uL (ref 850–3900)
MCH: 27.8 pg (ref 27.0–33.0)
MCHC: 33.5 g/dL (ref 32.0–36.0)
MCV: 83.1 fL (ref 80.0–100.0)
MPV: 10.1 fL (ref 7.5–12.5)
Monocytes Relative: 4 %
Neutro Abs: 4343 cells/uL (ref 1500–7800)
Neutrophils Relative %: 56.4 %
Platelets: 400 10*3/uL (ref 140–400)
RBC: 5.43 10*6/uL (ref 4.20–5.80)
RDW: 13.5 % (ref 11.0–15.0)
Total Lymphocyte: 37.7 %
WBC: 7.7 10*3/uL (ref 3.8–10.8)

## 2022-05-30 LAB — HEMOGLOBIN A1C
Hgb A1c MFr Bld: 5.8 % of total Hgb — ABNORMAL HIGH (ref <5.7)
Mean Plasma Glucose: 120 mg/dL
eAG (mmol/L): 6.6 mmol/L

## 2022-05-30 LAB — COMPLETE METABOLIC PANEL WITH GFR
AG Ratio: 1.4 (calc) (ref 1.0–2.5)
ALT: 17 U/L (ref 9–46)
AST: 14 U/L (ref 10–40)
Albumin: 4.4 g/dL (ref 3.6–5.1)
Alkaline phosphatase (APISO): 56 U/L (ref 36–130)
BUN: 15 mg/dL (ref 7–25)
CO2: 28 mmol/L (ref 20–32)
Calcium: 9.8 mg/dL (ref 8.6–10.3)
Chloride: 106 mmol/L (ref 98–110)
Creat: 1.01 mg/dL (ref 0.60–1.26)
Globulin: 3.1 g/dL (calc) (ref 1.9–3.7)
Glucose, Bld: 107 mg/dL — ABNORMAL HIGH (ref 65–99)
Potassium: 4.7 mmol/L (ref 3.5–5.3)
Sodium: 141 mmol/L (ref 135–146)
Total Bilirubin: 0.4 mg/dL (ref 0.2–1.2)
Total Protein: 7.5 g/dL (ref 6.1–8.1)
eGFR: 100 mL/min/{1.73_m2} (ref >=60)

## 2022-05-30 LAB — ADVANCED WRITTEN NOTIFICATION (AWN) TEST REFUSAL

## 2022-05-30 LAB — TSH: TSH: 1.35 mIU/L (ref 0.40–4.50)

## 2022-06-01 ENCOUNTER — Encounter: Payer: Self-pay | Admitting: Family Medicine

## 2022-06-01 ENCOUNTER — Ambulatory Visit (INDEPENDENT_AMBULATORY_CARE_PROVIDER_SITE_OTHER): Payer: BC Managed Care – PPO | Admitting: Family Medicine

## 2022-06-01 VITALS — BP 122/66 | HR 75 | Ht 69.5 in | Wt 324.0 lb

## 2022-06-01 DIAGNOSIS — G4733 Obstructive sleep apnea (adult) (pediatric): Secondary | ICD-10-CM | POA: Diagnosis not present

## 2022-06-01 DIAGNOSIS — R7303 Prediabetes: Secondary | ICD-10-CM | POA: Diagnosis not present

## 2022-06-01 DIAGNOSIS — Z0000 Encounter for general adult medical examination without abnormal findings: Secondary | ICD-10-CM

## 2022-06-01 DIAGNOSIS — Z6841 Body Mass Index (BMI) 40.0 and over, adult: Secondary | ICD-10-CM

## 2022-06-01 NOTE — Patient Instructions (Addendum)
Thank you for coming to the office today.  After you complete the next Sleep STudy with CPAP machine We will need to get it authorized by insurance. Usually the requirement is for you to wear it >70% of the nights for 30 days consecutively. Once you have done this, the machine records the data and we can use it as proof to the insurance. Please schedule a follow up to see me back after you have used the machine for at least 30 days. We can do in person or virtual  Recent Labs    05/29/22 0924  HGBA1C 5.8*   Pre Diabetes range  Goal to limit carb starches sugars  Also I would suggest 6 month follow up on weight management and A1c sugar progress.   Eat at least 3 meals and 1-2 snacks per day (don't skip breakfast).  Aim for no more than 5 hours between eating. - Tip: If you go >5 hours without eating and become very hungry, your body will supply it's own resources temporarily and you can gain extra weight when you eat.   Diet Recommendations for Preventing Diabetes   REDUCE Starchy (carb) foods include: Bread, rice, pasta, potatoes, corn, crackers, bagels, muffins, all baked goods.   FRUITS - LIMIT these HIGH sugar/carb fruits = Pineapple, Watermelon, Bananas - OKAY with these MEDIUM sugar/carb fruits = Citrus, Oranges, Grapes - PREFER these LOW sugar/carb fruits = Apples, Berries, Pears, Plums  Protein foods include: Meat, fish, poultry, eggs, dairy foods, and beans such as pinto and kidney beans (beans also provide carbohydrate).   1. Eat at least 3 meals and 1-2 snacks per day. Never go more than 4-5 hours while awake without eating.   2. Limit starchy foods to TWO per meal and ONE per snack. ONE portion of a starchy  food is equal to the following:   - ONE slice of bread (or its equivalent, such as half of a hamburger bun).   - 1/2 cup of a "scoopable" starchy food such as potatoes or rice.   - 1 OUNCE (28 grams) of starchy snacks (crackers or pretzels, look on label).   -  15 grams of carbohydrate as shown on food label.   3. Both lunch and dinner should include a protein food, a carb food, and vegetables.   - Obtain twice as many veg's as protein or carbohydrate foods for both lunch and dinner.   - Try to keep frozen veg's on hand for a quick vegetable serving.     - Fresh or frozen veg's are best.   4. Breakfast should always include protein.     Please schedule a Follow-up Appointment to: Return in about 6 months (around 11/30/2022) for Virtual Video visit 30 days after start CPAP, and also August 2024 follow-up PreDM A1c, Weight, OSA.  If you have any other questions or concerns, please feel free to call the office or send a message through [facility]. You may also schedule an earlier appointment if necessary.  Additionally, you may be receiving a survey about your experience at our office within a few days to 1 week by e-mail or mail. We value your feedback.  Nobie Putnam, DO [facility]

## 2022-06-01 NOTE — Assessment & Plan Note (Signed)
Mild elevated A1c 5.8, new diagnosis Concern with obesity OSA  Plan:  1. Not on any therapy currently  2. Encourage improved lifestyle - low carb, low sugar diet, reduce portion size, continue improving regular exercise 3. Follow-up 6 month A1c

## 2022-06-01 NOTE — Assessment & Plan Note (Addendum)
BMI >47 Weight gain past 2-3 months 10 lbs At 324 lbs Secondary OSA, Pre Diabetes Emphasis on lifestyle diet exercise Offer medications or referrals, he declines today.

## 2022-06-01 NOTE — Assessment & Plan Note (Addendum)
Scheduled w/ [facility] for CPAP Titration 06/06/22 He will need to follow up 30-90 days after with me for CPAP compliance to get authorized.

## 2022-06-01 NOTE — Progress Notes (Signed)
Subjective:    Patient ID: Maxwell Joyce, male    DOB: [birth year], 37 y.o.   MRN: [id number]  Maxwell Joyce is a 37 y.o. male presenting on 06/01/2022 for Prediabetes and lab results   HPI  Here for Annual Physical and Lab Review  Obstructive Sleep Apnea Recently completed PSG testing by [place] 05/2022 He was diagnosed with severe OSA AHI 40 and CPAP titration scheduled now 06/06/22 He will follow up after he completes that test and orders CPAP  Morbid Obesity BMI >47 Weight up to 324 lbs He is going to keep improving exercise at gym and diet  History of Carpal Tunnel L side - may affect him if he is driving but not at work. History of R hand boxer's fracture  Health Maintenance: Declines TDap vaccine flu shot.     06/01/2022    9:41 AM 03/20/2022   10:30 AM  Depression screen PHQ 2/9  Decreased Interest 1 2  Down, Depressed, Hopeless 0 0  PHQ - 2 Score 1 2  Altered sleeping 0 0  Tired, decreased energy 2 3  Change in appetite 0 0  Feeling bad or failure about yourself  0 0  Trouble concentrating 0 0  Moving slowly or fidgety/restless 0 0  Suicidal thoughts 0 0  PHQ-9 Score 3 5  Difficult doing work/chores Not difficult at all Not difficult at all    History reviewed. No pertinent past medical history. Past Surgical History:  Procedure Laterality Date   HAND SURGERY Right 2016   Boxer's fracture, repaired   TONSILLECTOMY Bilateral 1994   Social History   Socioeconomic History   Marital status: Single    Spouse name: Not on file   Number of children: Not on file   Years of education: Not on file   Highest education level: Not on file  Occupational History   Not on file  Tobacco Use   Smoking status: Never   Smokeless tobacco: Never  Vaping Use   Vaping Use: Never used  Substance and Sexual Activity   Alcohol use: No   Drug use: No   Sexual activity: Not on file  Other Topics Concern   Not on file  Social History Narrative    Not on file   Social Determinants of Health   Financial Resource Strain: Not on file  Food Insecurity: Not on file  Transportation Needs: Not on file  Physical Activity: Not on file  Stress: Not on file  Social Connections: Not on file  Intimate Partner Violence: Not on file   Family History  Problem Relation Age of Onset   Hypertension Father    Stroke Maternal Grandmother    Dementia Maternal Grandmother    Parkinson's disease Paternal Grandmother    No current outpatient medications on file prior to visit.   No current facility-administered medications on file prior to visit.    Review of Systems  Constitutional:  Positive for fatigue. Negative for activity change, appetite change, chills, diaphoresis and fever.  HENT:  Negative for congestion and hearing loss.   Eyes:  Negative for visual disturbance.  Respiratory:  Negative for cough, chest tightness, shortness of breath and wheezing.   Cardiovascular:  Negative for chest pain, palpitations and leg swelling.  Gastrointestinal:  Negative for abdominal pain, constipation, diarrhea, nausea and vomiting.  Genitourinary:  Negative for dysuria, frequency and hematuria.  Musculoskeletal:  Negative for arthralgias and neck pain.  Skin:  Negative for rash.  Neurological:  Negative for dizziness, weakness, light-headedness, numbness and headaches.  Hematological:  Negative for adenopathy.  Psychiatric/Behavioral:  Positive for sleep disturbance. Negative for behavioral problems and dysphoric mood.    Per HPI unless specifically indicated above      Objective:    BP 122/66   Pulse 75   Ht 5' 9.5" (1.765 m)   Wt (!) 324 lb (147 kg)   SpO2 97%   BMI 47.16 kg/m   Wt Readings from Last 3 Encounters:  06/01/22 (!) 324 lb (147 kg)  03/20/22 (!) 317 lb (143.8 kg)  05/22/21 294 lb 15.6 oz (133.8 kg)    Physical Exam Vitals and nursing note reviewed.  Constitutional:      General: He is not in acute distress.     Appearance: He is well-developed. He is obese. He is not diaphoretic.     Comments: Well-appearing, comfortable, cooperative  HENT:     Head: Normocephalic and atraumatic.  Eyes:     General:        Right eye: No discharge.        Left eye: No discharge.     Conjunctiva/sclera: Conjunctivae normal.     Pupils: Pupils are equal, round, and reactive to light.  Neck:     Thyroid: No thyromegaly.  Cardiovascular:     Rate and Rhythm: Normal rate and regular rhythm.     Pulses: Normal pulses.     Heart sounds: Normal heart sounds. No murmur heard. Pulmonary:     Effort: Pulmonary effort is normal. No respiratory distress.     Breath sounds: Normal breath sounds. No wheezing or rales.  Abdominal:     General: Bowel sounds are normal. There is no distension.     Palpations: Abdomen is soft. There is no mass.     Tenderness: There is no abdominal tenderness.  Musculoskeletal:        General: No tenderness. Normal range of motion.     Cervical back: Normal range of motion and neck supple.     Comments: Upper / Lower Extremities: - Normal muscle tone, strength bilateral upper extremities 5/5, lower extremities 5/5  Lymphadenopathy:     Cervical: No cervical adenopathy.  Skin:    General: Skin is warm and dry.     Findings: No erythema or rash.  Neurological:     Mental Status: He is alert and oriented to person, place, and time.     Comments: Distal sensation intact to light touch all extremities  Psychiatric:        Mood and Affect: Mood normal.        Behavior: Behavior normal.        Thought Content: Thought content normal.     Comments: Well groomed, good eye contact, normal speech and thoughts       Results for orders placed or performed in visit on 05/18/22  TSH  Result Value Ref Range   TSH 1.35 0.40 - 4.50 mIU/L  Hemoglobin A1c  Result Value Ref Range   Hgb A1c MFr Bld 5.8 (H) <5.7 % of total Hgb   Mean Plasma Glucose 120 mg/dL   eAG (mmol/L) 6.6 mmol/L  CBC with  Differential/Platelet  Result Value Ref Range   WBC 7.7 3.8 - 10.8 Thousand/uL   RBC 5.43 4.20 - 5.80 Million/uL   Hemoglobin 15.1 13.2 - 17.1 g/dL   HCT 45.1 38.5 - 50.0 %   MCV 83.1 80.0 - 100.0 fL   MCH 27.8 27.0 - 33.0  pg   MCHC 33.5 32.0 - 36.0 g/dL   RDW 13.5 11.0 - 15.0 %   Platelets 400 140 - 400 Thousand/uL   MPV 10.1 7.5 - 12.5 fL   Neutro Abs 4,343 1,500 - 7,800 cells/uL   Lymphs Abs 2,903 850 - 3,900 cells/uL   Absolute Monocytes 308 200 - 950 cells/uL   Eosinophils Absolute 100 15 - 500 cells/uL   Basophils Absolute 46 0 - 200 cells/uL   Neutrophils Relative % 56.4 %   Total Lymphocyte 37.7 %   Monocytes Relative 4.0 %   Eosinophils Relative 1.3 %   Basophils Relative 0.6 %  COMPLETE METABOLIC PANEL WITH GFR  Result Value Ref Range   Glucose, Bld 107 (H) 65 - 99 mg/dL   BUN 15 7 - 25 mg/dL   Creat 1.01 0.60 - 1.26 mg/dL   eGFR 100 > OR = 60 mL/min/1.34m   BUN/Creatinine Ratio SEE NOTE: 6 - 22 (calc)   Sodium 141 135 - 146 mmol/L   Potassium 4.7 3.5 - 5.3 mmol/L   Chloride 106 98 - 110 mmol/L   CO2 28 20 - 32 mmol/L   Calcium 9.8 8.6 - 10.3 mg/dL   Total Protein 7.5 6.1 - 8.1 g/dL   Albumin 4.4 3.6 - 5.1 g/dL   Globulin 3.1 1.9 - 3.7 g/dL (calc)   AG Ratio 1.4 1.0 - 2.5 (calc)   Total Bilirubin 0.4 0.2 - 1.2 mg/dL   Alkaline phosphatase (APISO) 56 36 - 130 U/L   AST 14 10 - 40 U/L   ALT 17 9 - 46 U/L  Advanced Written Notification ([facility] Test Refusal  Result Value Ref Range   RAM1     AWN TEST REFUSED LIPID       Assessment & Plan:   Problem List Items Addressed This Visit     Morbid obesity with BMI of 45.0-49.9, adult (HCC)    BMI >47 Weight gain past 2-3 months 10 lbs At 324 lbs Secondary OSA, Pre Diabetes Emphasis on lifestyle diet exercise Offer medications or referrals, he declines today.      OSA (obstructive sleep apnea)    Scheduled w/ [facility] for CPAP Titration 06/06/22 He will need to follow up 30-90 days after with me for  CPAP compliance to get authorized.      Pre-diabetes    Mild elevated A1c 5.8, new diagnosis Concern with obesity OSA  Plan:  1. Not on any therapy currently  2. Encourage improved lifestyle - low carb, low sugar diet, reduce portion size, continue improving regular exercise 3. Follow-up 6 month A1c       Other Visit Diagnoses     Annual physical exam    -  Primary       Updated Health Maintenance information Reviewed recent lab results with patient Encouraged improvement to lifestyle with diet and exercise Goal of weight loss   No orders of the defined types were placed in this encounter.     Follow up plan: Return in about 6 months (around 11/30/2022) for Virtual Video visit 30 days after start CPAP, and also August 2024 follow-up PreDM A1c, Weight, OSA.  ANobie Putnam [facility] 06/01/2022, 9:02 AM

## 2022-08-27 ENCOUNTER — Encounter: Payer: Self-pay | Admitting: Family Medicine

## 2022-08-27 ENCOUNTER — Encounter: Payer: Self-pay | Admitting: Emergency Medicine

## 2022-08-27 ENCOUNTER — Ambulatory Visit
Admission: EM | Admit: 2022-08-27 | Discharge: 2022-08-27 | Disposition: A | Discharge status: Home / Self Care | Payer: BC Managed Care – PPO | Attending: Physician Assistant | Admitting: Physician Assistant

## 2022-08-27 DIAGNOSIS — R55 Syncope and collapse: Secondary | ICD-10-CM

## 2022-08-27 LAB — GLUCOSE, CAPILLARY: Glucose-Capillary: 100 mg/dL — ABNORMAL HIGH (ref 70–99)

## 2022-08-27 NOTE — ED Provider Notes (Signed)
[facility]    CSN: [id number] Arrival date & time: 08/27/22  1909      History   Chief Complaint Chief Complaint  Patient presents with   Syncope    HPI Maxwell Joyce is a 37 y.o. male presenting for syncopal episode that occurred at work 1 hour ago. He says he was looking back in his chair and laughing with co-workers when he fell back and ended up on the floor. He does not know if he passed out before or after falling. He says he was told it was a second or two. He feels well at this time and says he never had any prodromal symptoms.  Denies any head swelling, bruising or signs of trauma.  He denies headaches, dizziness, weakness, facial drooping, numbness/tingling, vision changes, speech or balance problems, chest pain, palpitations, SOB, abdominal pain. Denies fever, cough, congestion or recent illness. No previous similar episodes.  Patient says he is healthy.  He had a annual exam with his primary care provider about 3 months ago.  He has a history of obesity, sleep apnea and prediabetes but no history of hypertension, heart or lung disease. No injuries.   HPI  History reviewed. No pertinent past medical history.  Patient Active Problem List   Diagnosis Date Noted   Pre-diabetes 06/01/2022   OSA (obstructive sleep apnea) 05/17/2022   Morbid obesity with BMI of 45.0-49.9, adult (HCC) 03/20/2022    Past Surgical History:  Procedure Laterality Date   HAND SURGERY Right 2016   Boxer's fracture, repaired   TONSILLECTOMY Bilateral 1994       Home Medications    Prior to Admission medications   Not on File    Family History Family History  Problem Relation Age of Onset   Hypertension Father    Stroke Maternal Grandmother    Dementia Maternal Grandmother    Parkinson's disease Paternal Grandmother     Social History Social History   Tobacco Use   Smoking status: Never   Smokeless tobacco: Never  Vaping Use   Vaping Use: Never used   Substance Use Topics   Alcohol use: No   Drug use: No     Allergies   Patient has no known allergies.   Review of Systems Review of Systems  Constitutional:  Negative for fatigue and fever.  Eyes:  Negative for visual disturbance.  Respiratory:  Negative for shortness of breath.   Cardiovascular:  Negative for chest pain and palpitations.  Gastrointestinal:  Negative for abdominal pain, nausea and vomiting.  Musculoskeletal:  Negative for back pain, neck pain and neck stiffness.  Skin:  Negative for color change and wound.  Neurological:  Positive for syncope. Negative for dizziness, tremors, seizures, facial asymmetry, speech difficulty, weakness, light-headedness, numbness and headaches.  Psychiatric/Behavioral:  Negative for confusion.      Physical Exam Triage Vital Signs ED Triage Vitals  Enc Vitals Group     BP 08/27/22 1925 (!) 145/84     Pulse Rate 08/27/22 1925 77     Resp 08/27/22 1925 18     Temp 08/27/22 1925 98.1 F (36.7 C)     Temp Source 08/27/22 1925 Oral     SpO2 08/27/22 1925 99 %     Weight 08/27/22 1926 (!) 340 lb (154.2 kg)     Height 08/27/22 1926 5\' 10"  (1.778 m)     Head Circumference --      Peak Flow --      Pain Score  08/27/22 1925 0     Pain Loc --      Pain Edu? --      Excl. in GC? --    No data found.  Updated Vital Signs BP 135/77   Pulse 77   Temp 98.1 F (36.7 C) (Oral)   Resp 18   Ht 5\' 10"  (1.778 m)   Wt (!) 340 lb (154.2 kg)   SpO2 99%   BMI 48.78 kg/m     Physical Exam Vitals and nursing note reviewed.  Constitutional:      General: He is not in acute distress.    Appearance: Normal appearance. He is well-developed. He is obese. He is not ill-appearing.  HENT:     Head: Normocephalic and atraumatic.     Right Ear: Tympanic membrane, ear canal and external ear normal.     Left Ear: Tympanic membrane, ear canal and external ear normal.     Nose: Nose normal.     Mouth/Throat:     Mouth: Mucous membranes are  moist.     Pharynx: Oropharynx is clear.  Eyes:     General: No scleral icterus.    Extraocular Movements: Extraocular movements intact.     Conjunctiva/sclera: Conjunctivae normal.     Pupils: Pupils are equal, round, and reactive to light.  Cardiovascular:     Rate and Rhythm: Normal rate and regular rhythm.     Heart sounds: Normal heart sounds.  Pulmonary:     Effort: Pulmonary effort is normal. No respiratory distress.     Breath sounds: Normal breath sounds.  Abdominal:     Palpations: Abdomen is soft.     Tenderness: There is no abdominal tenderness.  Musculoskeletal:     Cervical back: Neck supple.     Right lower leg: No edema.     Left lower leg: No edema.  Skin:    General: Skin is warm and dry.     Capillary Refill: Capillary refill takes less than 2 seconds.  Neurological:     General: No focal deficit present.     Mental Status: He is alert and oriented to person, place, and time. Mental status is at baseline.     Cranial Nerves: No cranial nerve deficit.     Motor: No weakness.     Coordination: Coordination normal.     Gait: Gait normal.     Comments: 5/5 strength  bilateral upper and lower extremities. Normal nose to finger testing.  Psychiatric:        Mood and Affect: Mood normal.        Behavior: Behavior normal.      UC Treatments / Results  Labs (all labs ordered are listed, but only abnormal results are displayed) Labs Reviewed  GLUCOSE, CAPILLARY - Abnormal; Notable for the following components:      Result Value   Glucose-Capillary 100 (*)    All other components within normal limits  CBG MONITORING, ED    EKG   Radiology No results found.  Procedures ED EKG  Date/Time: 08/28/2022 8:17 AM  Performed by: Shirlee Latch, PA-C Authorized by: Shirlee Latch, PA-C   Previous ECG:    Previous ECG:  Unavailable Interpretation:    Interpretation: normal   Rate:    ECG rate:  76   ECG rate assessment: normal   Rhythm:    Rhythm:  sinus rhythm   Ectopy:    Ectopy: none   QRS:    QRS axis:  Normal   QRS intervals:  Normal   QRS conduction: normal   ST segments:    ST segments:  Normal T waves:    T waves: normal   Comments:     Normal sinus rhythm. Regular rate.  (including critical care time)  Medications Ordered in UC Medications - No data to display  Initial Impression / Assessment and Plan / UC Course  I have reviewed the triage vital signs and the nursing notes.  Pertinent labs & imaging results that were available during my care of the patient were reviewed by me and considered in my medical decision making (see chart for details).   37 y/o male presenting for evaluation after possible syncopal episode that occurred 1 hour ago and was witnessed by co-workers. They stated it happened when he was laughing and turning in his chair and then fell. It lasted for 1-2 seconds. No prodrome. He denies headaches, dizziness, weakness, facial drooping, numbness/tingling, vision changes, speech or balance problems, chest pain, palpitations, SOB, abdominal pain.   Vitals are all normal and stable.   EKG is normal.   Fingerstick glucose is 100.   Normal exam today. He is feeling well and denying any symptoms as well. Explained to patient that everything is very re-assuring. Suspect possible vasovagal syncope related to laughing very hard or if he was stunned for a second or 2 after falling. Supportive care at this time. Advised to call EMS or go to ED if this occurs again or if he has any other red flag signs/symptoms.   Final Clinical Impressions(s) / UC Diagnoses   Final diagnoses:  Syncope and collapse     Discharge Instructions      -Your EKG was normal today. - Your blood sugar was also normal. - Vital signs all look good.  Blood pressure initially high but it came down to a normal range. - Your exam was also normal. - I believe your symptoms could have been due to a vasovagal syncopal episode related  to how hard you are laughing.  It is also possible the accidental fall could have stemmed you and cause a extremely brief loss of consciousness for couple seconds.  At this time I do not think there is anything to worry about but if it does happen again or if at any point you start to experience headaches, dizziness, vomiting, speech or balance problems, numbness/tingling/weakness, chest pain, racing heart, shortness of breath, sweats, etc. you should go to the emergency department for reevaluation or call EMS.     ED Prescriptions   None    PDMP not reviewed this encounter.   Shirlee Latch, PA-C 08/28/22 [phone]

## 2022-08-27 NOTE — ED Triage Notes (Signed)
Pt states he was on break laughing with his co-workers and he passed out. He was sitting in a chair and fell out of it hitting his head. He did not have any warning signs prior to the incident happening. At this time the patient states he's fine with no headache or any other signs.

## 2022-08-27 NOTE — Discharge Instructions (Signed)
-  Your EKG was normal today. - Your blood sugar was also normal. - Vital signs all look good.  Blood pressure initially high but it came down to a normal range. - Your exam was also normal. - I believe your symptoms could have been due to a vasovagal syncopal episode related to how hard you are laughing.  It is also possible the accidental fall could have stemmed you and cause a extremely brief loss of consciousness for couple seconds.  At this time I do not think there is anything to worry about but if it does happen again or if at any point you start to experience headaches, dizziness, vomiting, speech or balance problems, numbness/tingling/weakness, chest pain, racing heart, shortness of breath, sweats, etc. you should go to the emergency department for reevaluation or call EMS.

## 2023-12-13 ENCOUNTER — Ambulatory Visit
Admission: EM | Admit: 2023-12-13 | Discharge: 2023-12-13 | Disposition: A | Discharge status: Home / Self Care | Attending: Emergency Medicine | Admitting: Emergency Medicine

## 2023-12-13 ENCOUNTER — Encounter: Payer: Self-pay | Admitting: Emergency Medicine

## 2023-12-13 DIAGNOSIS — M545 Low back pain, unspecified: Secondary | ICD-10-CM | POA: Diagnosis not present

## 2023-12-13 MED ORDER — PREDNISONE 10 MG (21) PO TBPK: ORAL_TABLET | ORAL | 0 refills | Status: DC

## 2023-12-13 MED ORDER — BACLOFEN 10 MG PO TABS: 10.0000 mg | ORAL_TABLET | Freq: Three times a day (TID) | ORAL | 0 refills | Status: DC

## 2023-12-13 MED ORDER — DEXAMETHASONE SODIUM PHOSPHATE 10 MG/ML IJ SOLN
10.0000 mg | Freq: Once | INTRAMUSCULAR | Status: AC
  Administered 2023-12-13: 10 mg via INTRAMUSCULAR

## 2023-12-13 NOTE — Discharge Instructions (Addendum)
 Take the prednisone  according to the package instructions to help with inflammation and pain in your low back.  Take the baclofen , 10 mg every 8 hours, on a schedule for the next 48 hours and then as needed.  Apply moist heat to your back for 30 minutes at a time 2-3 times a day to improve blood flow to the area and help remove the lactic acid causing the spasm.  Follow the back exercises given at discharge.  Return for reevaluation for any new or worsening symptoms.

## 2023-12-13 NOTE — ED Provider Notes (Signed)
 [facility]    CSN: [id number] Arrival date & time: 12/13/23  1918      History   Chief Complaint Chief Complaint  Patient presents with   Back Pain    HPI Maxwell Joyce is a 38 y.o. male.   HPI  69 old male with past medical history significant for obesity, OSA, and prediabetes presents for evaluation of right-sided low back pain that started 3 weeks ago.  He reports that radiates down into the right buttock.  No known injury.  He reports he does a lot of heavy lifting at work and he thinks that that may have triggered the pain.  He has been using ibuprofen  and Aleve  at home without any improvement of symptoms.  History reviewed. No pertinent past medical history.  Patient Active Problem List   Diagnosis Date Noted   Pre-diabetes 06/01/2022   OSA (obstructive sleep apnea) 05/17/2022   Morbid obesity with BMI of 45.0-49.9, adult (HCC) 03/20/2022    Past Surgical History:  Procedure Laterality Date   HAND SURGERY Right 2016   Boxer's fracture, repaired   TONSILLECTOMY Bilateral 1994       Home Medications    Prior to Admission medications   Medication Sig Start Date End Date Taking? Authorizing Provider  baclofen  (LIORESAL ) 10 MG tablet Take 1 tablet (10 mg total) by mouth 3 (three) times daily. 12/13/23  Yes Bernardino Ditch, NP  predniSONE  (STERAPRED UNI-PAK 21 TAB) 10 MG (21) TBPK tablet Take 6 tablets on day 1, 5 tablets day 2, 4 tablets day 3, 3 tablets day 4, 2 tablets day 5, 1 tablet day 6 12/13/23  Yes Bernardino Ditch, NP    Family History Family History  Problem Relation Age of Onset   Hypertension Father    Stroke Maternal Grandmother    Dementia Maternal Grandmother    Parkinson's disease Paternal Grandmother     Social History Social History   Tobacco Use   Smoking status: Never   Smokeless tobacco: Never  Vaping Use   Vaping status: Never Used  Substance Use Topics   Alcohol use: No   Drug use: No     Allergies   Patient has no  known allergies.   Review of Systems Review of Systems  Musculoskeletal:  Positive for back pain.     Physical Exam Triage Vital Signs ED Triage Vitals  Encounter Vitals Group     BP 12/13/23 1937 129/81     Girls Systolic BP Percentile --      Girls Diastolic BP Percentile --      Boys Systolic BP Percentile --      Boys Diastolic BP Percentile --      Pulse Rate 12/13/23 1937 80     Resp 12/13/23 1937 18     Temp 12/13/23 1937 99.1 F (37.3 C)     Temp Source 12/13/23 1937 Oral     SpO2 12/13/23 1937 94 %     Weight 12/13/23 1937 (!) 339 lb 15.2 oz (154.2 kg)     Height 12/13/23 1937 5' 10 (1.778 m)     Head Circumference --      Peak Flow --      Pain Score 12/13/23 1936 6     Pain Loc --      Pain Education --      Exclude from Growth Chart --    No data found.  Updated Vital Signs BP 129/81 (BP Location: Right Arm)   Pulse  80   Temp 99.1 F (37.3 C) (Oral)   Resp 18   Ht 5' 10 (1.778 m)   Wt (!) 339 lb 15.2 oz (154.2 kg)   SpO2 94%   BMI 48.78 kg/m   Visual Acuity Right Eye Distance:   Left Eye Distance:   Bilateral Distance:    Right Eye Near:   Left Eye Near:    Bilateral Near:     Physical Exam Vitals and nursing note reviewed.  Constitutional:      Appearance: Normal appearance. He is not ill-appearing.  Musculoskeletal:        General: No tenderness or signs of injury.  Skin:    General: Skin is warm.     Capillary Refill: Capillary refill takes less than 2 seconds.  Neurological:     General: No focal deficit present.     Mental Status: He is alert and oriented to person, place, and time.      UC Treatments / Results  Labs (all labs ordered are listed, but only abnormal results are displayed) Labs Reviewed - No data to display  EKG   Radiology No results found.  Procedures Procedures (including critical care time)  Medications Ordered in UC Medications  dexamethasone  (DECADRON ) injection 10 mg (has no administration  in time range)    Initial Impression / Assessment and Plan / UC Course  I have reviewed the triage vital signs and the nursing notes.  Pertinent labs & imaging results that were available during my care of the patient were reviewed by me and considered in my medical decision making (see chart for details).   Patient is a pleasant, nontoxic-appearing 30 old male presenting for evaluation of right-sided low back pain radiating into the right buttock.  This has been present for the last 3 weeks and is not attributed to any injury.  The patient reports that if he is up and moving he is not really having pain it is only after he has been sitting at rest for a while.  Also when transitioning from sitting to standing and vice versa does he have pain.  In exam room he is currently standing and not sitting on the exam table he has no midline spinous process tenderness or step-off.  I am unable to reproduce the pain with palpation of the lumbar paraspinous region bilaterally or with palpation into the buttock.  I suspect that it is muscular in nature.  Given that he has used over-the-counter NSAIDs without any improvement of symptoms I will do a steroid taper starting tomorrow.  I will have staff administer 10 mg of IM Decadron  here in clinic tonight.  I will also prescribe baclofen  that he can use every 8 hours.  We discussed using moist heat and home physical therapy exercises as well.  Return precautions reviewed.  He denies need for work note.   Final Clinical Impressions(s) / UC Diagnoses   Final diagnoses:  Acute right-sided low back pain without sciatica     Discharge Instructions      Take the prednisone  according to the package instructions to help with inflammation and pain in your low back.  Take the baclofen , 10 mg every 8 hours, on a schedule for the next 48 hours and then as needed.  Apply moist heat to your back for 30 minutes at a time 2-3 times a day to improve blood flow to the area  and help remove the lactic acid causing the spasm.  Follow the back exercises  given at discharge.  Return for reevaluation for any new or worsening symptoms.      ED Prescriptions     Medication Sig Dispense Auth. Provider   predniSONE  (STERAPRED UNI-PAK 21 TAB) 10 MG (21) TBPK tablet Take 6 tablets on day 1, 5 tablets day 2, 4 tablets day 3, 3 tablets day 4, 2 tablets day 5, 1 tablet day 6 21 tablet Bernardino Ditch, NP   baclofen  (LIORESAL ) 10 MG tablet Take 1 tablet (10 mg total) by mouth 3 (three) times daily. 30 each Bernardino Ditch, NP      PDMP not reviewed this encounter.   Bernardino Ditch, NP 12/13/23 [id number]

## 2023-12-13 NOTE — ED Triage Notes (Signed)
 Pt c/o lower back pain. Started about 3 weeks ago. He states it radiates down to his left buttock. No known injury.

## 2024-05-06 ENCOUNTER — Ambulatory Visit
Admission: EM | Admit: 2024-05-06 | Discharge: 2024-05-06 | Disposition: A | Discharge status: Home / Self Care | Attending: Family Medicine | Admitting: Family Medicine

## 2024-05-06 DIAGNOSIS — J069 Acute upper respiratory infection, unspecified: Secondary | ICD-10-CM | POA: Diagnosis not present

## 2024-05-06 DIAGNOSIS — U071 COVID-19: Secondary | ICD-10-CM

## 2024-05-06 LAB — POC COVID19/FLU A&B COMBO
Covid Antigen, POC: POSITIVE — AB
Influenza A Antigen, POC: NEGATIVE
Influenza B Antigen, POC: NEGATIVE

## 2024-05-06 MED ORDER — PROMETHAZINE-DM 6.25-15 MG/5ML PO SYRP: 5.0000 mL | ORAL_SOLUTION | Freq: Four times a day (QID) | ORAL | 0 refills | Status: AC | PRN

## 2024-05-06 MED ORDER — IPRATROPIUM BROMIDE 0.06 % NA SOLN: 2.0000 | Freq: Four times a day (QID) | NASAL | 0 refills | Status: AC

## 2024-05-06 NOTE — Discharge Instructions (Addendum)
 Your influenza tests were negative however you have COVID.  You most likely have a viral respiratory infection that will gradually improve over the next 7-10 days. Cough may last up to 3 weeks. If your were prescribed medication, stop by the pharmacy to pick them up.   You can take Tylenol and/or Ibuprofen  as needed for fever reduction and pain relief.    For cough: honey 1/2 to 1 teaspoon (you can dilute the honey in water or another fluid).  Stop at the pharmacy to pick up your prescription cough medication. You can use a humidifier for chest congestion and cough.  If you don't have a humidifier, you can sit in the bathroom with the hot shower running.      For congestion: take a daily anti-histamine like Zyrtec, Claritin, and a oral decongestant, such as pseudoephedrine.  You can also use saline nasal spray. I prescribed you a nasal spray to help with congestion.    It is important to stay hydrated: drink plenty of fluids (water, gatorade/powerade/pedialyte, juices, or teas) to keep your throat moisturized and help further relieve irritation/discomfort.    Return or go to the Emergency Department if symptoms worsen or do not improve in the next few days

## 2024-05-06 NOTE — ED Provider Notes (Addendum)
 " [facility]    CSN: [id number] Arrival date & time: 05/06/24  9040      History   Chief Complaint Chief Complaint  Patient presents with   Cough   Nasal Congestion    HPI Rance GATLYN LIPARI is a 39 y.o. male.   HPI  History obtained from the patient. Isay presents for dry cough, chest congestion, nasal congestion, rhinorrhea, nausea, sinus pressure that started 3-4 days ago.  No ear pain, fever,  sore throat, vomiting, diarrhea and headache. No medications prior to arrival.  Symptoms are getting worse. His daughter got sick after him.   No history of asthma. Denies vaping and smoking.       History reviewed. No pertinent past medical history.  Patient Active Problem List   Diagnosis Date Noted   Pre-diabetes 06/01/2022   OSA (obstructive sleep apnea) 05/17/2022   Morbid obesity with BMI of 45.0-49.9, adult (HCC) 03/20/2022    Past Surgical History:  Procedure Laterality Date   HAND SURGERY Right 2016   Boxer's fracture, repaired   TONSILLECTOMY Bilateral 1994       Home Medications    Prior to Admission medications  Medication Sig Start Date End Date Taking? Authorizing Provider  ipratropium (ATROVENT ) 0.06 % nasal spray Place 2 sprays into both nostrils 4 (four) times daily. 05/06/24  Yes Brinna Divelbiss, DO  promethazine -dextromethorphan (PROMETHAZINE -DM) 6.25-15 MG/5ML syrup Take 5 mLs by mouth 4 (four) times daily as needed. 05/06/24  Yes Kriste Berth, DO    Family History Family History  Problem Relation Age of Onset   Hypertension Father    Stroke Maternal Grandmother    Dementia Maternal Grandmother    Parkinson's disease Paternal Grandmother     Social History Social History[1]   Allergies   Patient has no known allergies.   Review of Systems Review of Systems: negative unless otherwise stated in HPI.      Physical Exam Triage Vital Signs ED Triage Vitals  Encounter Vitals Group     BP 05/06/24 1020 (!) 149/78      Girls Systolic BP Percentile --      Girls Diastolic BP Percentile --      Boys Systolic BP Percentile --      Boys Diastolic BP Percentile --      Pulse Rate 05/06/24 1020 91     Resp 05/06/24 1020 18     Temp 05/06/24 1020 98.4 F (36.9 C)     Temp src --      SpO2 05/06/24 1020 98 %     Weight 05/06/24 1017 (!) 348 lb (157.9 kg)     Height --      Head Circumference --      Peak Flow --      Pain Score 05/06/24 1017 0     Pain Loc --      Pain Education --      Exclude from Growth Chart --    No data found.  Updated Vital Signs BP (!) 149/78   Pulse 91   Temp 98.4 F (36.9 C)   Resp 18   Wt (!) 157.9 kg   SpO2 98%   BMI 49.93 kg/m   Visual Acuity Right Eye Distance:   Left Eye Distance:   Bilateral Distance:    Right Eye Near:   Left Eye Near:    Bilateral Near:     Physical Exam GEN:     alert, non-toxic appearing male in no distress  HENT:  mucus membranes moist, oropharyngeal without lesions or erythema, no tonsillar hypertrophy or exudates, moderate erythematous edematous turbinates, clear nasal discharge EYES:   no scleral injection or discharge NECK:  normal ROM, no lymphadenopathy, no meningismus   RESP:  no increased work of breathing, clear to auscultation bilaterally CVS:   regular rate and rhythm Skin:   warm and dry    UC Treatments / Results  Labs (all labs ordered are listed, but only abnormal results are displayed) Labs Reviewed  POC COVID19/FLU A&B COMBO - Abnormal; Notable for the following components:      Result Value   Covid Antigen, POC Positive (*)    All other components within normal limits    EKG   Radiology No results found.   Procedures Procedures (including critical care time)  Medications Ordered in UC Medications - No data to display  Initial Impression / Assessment and Plan / UC Course  I have reviewed the triage vital signs and the nursing notes.  Pertinent labs & imaging results that were  available during my care of the patient were reviewed by me and considered in my medical decision making (see chart for details).       Pt is a 39 y.o. male who presents for 3 days of respiratory symptoms. Romin is afebrile here without recent antipyretics. Satting well on room air. Overall pt is non-toxic appearing, well hydrated, without respiratory distress. Pulmonary exam is unremarkable.  POC COVID and influenza panel obtained and he is COVID positive.  Antiviral treatment was deferred.  Discussed symptomatic treatment.  Promethazine  DM for cough. Atrovent  nasal spray for nasal congestion.  Explained lack of efficacy of antibiotics in viral disease.  Typical duration of symptoms discussed.   Return and ED precautions given and voiced understanding. Discussed MDM, treatment plan and plan for follow-up with patient who agrees with plan.     Final Clinical Impressions(s) / UC Diagnoses   Final diagnoses:  Viral URI with cough  COVID-19     Discharge Instructions      Your influenza tests were negative however you have COVID.  You most likely have a viral respiratory infection that will gradually improve over the next 7-10 days. Cough may last up to 3 weeks. If your were prescribed medication, stop by the pharmacy to pick them up.   You can take Tylenol and/or Ibuprofen  as needed for fever reduction and pain relief.    For cough: honey 1/2 to 1 teaspoon (you can dilute the honey in water or another fluid).  Stop at the pharmacy to pick up your prescription cough medication. You can use a humidifier for chest congestion and cough.  If you don't have a humidifier, you can sit in the bathroom with the hot shower running.      For congestion: take a daily anti-histamine like Zyrtec, Claritin, and a oral decongestant, such as pseudoephedrine.  You can also use saline nasal spray. I prescribed you a nasal spray to help with congestion.    It is important to stay hydrated: drink plenty of  fluids (water, gatorade/powerade/pedialyte, juices, or teas) to keep your throat moisturized and help further relieve irritation/discomfort.    Return or go to the Emergency Department if symptoms worsen or do not improve in the next few days      ED Prescriptions     Medication Sig Dispense Auth. Provider   promethazine -dextromethorphan (PROMETHAZINE -DM) 6.25-15 MG/5ML syrup Take 5 mLs by mouth 4 (four) times daily as needed.  118 mL Joeanne Robicheaux, DO   ipratropium (ATROVENT ) 0.06 % nasal spray Place 2 sprays into both nostrils 4 (four) times daily. 15 mL Skyrah Krupp, DO      PDMP not reviewed this encounter.        [1]  Social History Tobacco Use   Smoking status: Never   Smokeless tobacco: Never  Vaping Use   Vaping status: Never Used  Substance Use Topics   Alcohol use: No   Drug use: No     Gemini Beaumier, DO 05/06/24 1106  "

## 2024-05-06 NOTE — ED Triage Notes (Signed)
 Patient to Urgent Care with complaints of  dry cough/ runny nose/ weakness.  Symptoms started Saturday.   No otc meds.
